# Patient Record
Sex: Female | Born: 1980 | Race: White | Hispanic: No | Marital: Single | Smoking: Current every day smoker
Health system: Southern US, Community
[De-identification: ages and names within clinical notes are randomized; demographics above are authoritative.]

## PROBLEM LIST (undated history)

## (undated) DIAGNOSIS — F32A Depression, unspecified: Secondary | ICD-10-CM

## (undated) DIAGNOSIS — F329 Major depressive disorder, single episode, unspecified: Secondary | ICD-10-CM

## (undated) DIAGNOSIS — F1910 Other psychoactive substance abuse, uncomplicated: Secondary | ICD-10-CM

## (undated) DIAGNOSIS — F419 Anxiety disorder, unspecified: Secondary | ICD-10-CM

## (undated) HISTORY — PX: NO PAST SURGERIES: SHX2092

---
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
## 2008-10-09 ENCOUNTER — Emergency Department (HOSPITAL_COMMUNITY): Admission: EM | Admit: 2008-10-09 | Discharge: 2008-10-09 | Payer: Self-pay | Admitting: Emergency Medicine

## 2010-03-02 DIAGNOSIS — F1910 Other psychoactive substance abuse, uncomplicated: Secondary | ICD-10-CM

## 2010-03-02 HISTORY — DX: Other psychoactive substance abuse, uncomplicated: F19.10

## 2010-03-28 ENCOUNTER — Emergency Department (HOSPITAL_COMMUNITY)
Admission: EM | Admit: 2010-03-28 | Discharge: 2010-03-28 | Payer: Self-pay | Source: Home / Self Care | Admitting: Emergency Medicine

## 2010-06-08 LAB — URINALYSIS, ROUTINE W REFLEX MICROSCOPIC
Nitrite: NEGATIVE
Specific Gravity, Urine: 1.017 (ref 1.005–1.030)
Urobilinogen, UA: 1 mg/dL (ref 0.0–1.0)

## 2010-06-08 LAB — CBC
Platelets: 243 10*3/uL (ref 150–400)
RDW: 13.4 % (ref 11.5–15.5)

## 2010-06-08 LAB — RAPID URINE DRUG SCREEN, HOSP PERFORMED
Amphetamines: NOT DETECTED
Cocaine: NOT DETECTED
Opiates: NOT DETECTED
Tetrahydrocannabinol: POSITIVE — AB

## 2010-06-08 LAB — BASIC METABOLIC PANEL
BUN: 3 mg/dL — ABNORMAL LOW (ref 6–23)
Calcium: 9.3 mg/dL (ref 8.4–10.5)
GFR calc non Af Amer: >60 mL/min (ref >60)
Glucose, Bld: 101 mg/dL — ABNORMAL HIGH (ref 70–99)
Sodium: 140 mEq/L (ref 135–145)

## 2010-06-08 LAB — URINE MICROSCOPIC-ADD ON
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
## 2018-11-10 ENCOUNTER — Encounter (HOSPITAL_COMMUNITY): Payer: Self-pay | Admitting: Certified Nurse Midwife

## 2018-11-10 ENCOUNTER — Inpatient Hospital Stay (HOSPITAL_COMMUNITY)
Admission: AD | Admit: 2018-11-10 | Discharge: 2018-11-10 | Payer: Self-pay | Attending: Obstetrics and Gynecology | Admitting: Obstetrics and Gynecology

## 2018-11-10 DIAGNOSIS — Z3A15 15 weeks gestation of pregnancy: Secondary | ICD-10-CM | POA: Insufficient documentation

## 2018-11-10 DIAGNOSIS — R109 Unspecified abdominal pain: Secondary | ICD-10-CM

## 2018-11-10 DIAGNOSIS — F17210 Nicotine dependence, cigarettes, uncomplicated: Secondary | ICD-10-CM | POA: Insufficient documentation

## 2018-11-10 DIAGNOSIS — O99322 Drug use complicating pregnancy, second trimester: Secondary | ICD-10-CM

## 2018-11-10 DIAGNOSIS — R102 Pelvic and perineal pain: Secondary | ICD-10-CM | POA: Insufficient documentation

## 2018-11-10 DIAGNOSIS — O99332 Smoking (tobacco) complicating pregnancy, second trimester: Secondary | ICD-10-CM | POA: Insufficient documentation

## 2018-11-10 DIAGNOSIS — F1120 Opioid dependence, uncomplicated: Secondary | ICD-10-CM

## 2018-11-10 DIAGNOSIS — O99612 Diseases of the digestive system complicating pregnancy, second trimester: Secondary | ICD-10-CM | POA: Insufficient documentation

## 2018-11-10 DIAGNOSIS — O26899 Other specified pregnancy related conditions, unspecified trimester: Secondary | ICD-10-CM

## 2018-11-10 DIAGNOSIS — O26892 Other specified pregnancy related conditions, second trimester: Secondary | ICD-10-CM | POA: Insufficient documentation

## 2018-11-10 DIAGNOSIS — K5900 Constipation, unspecified: Secondary | ICD-10-CM | POA: Insufficient documentation

## 2018-11-10 DIAGNOSIS — O09522 Supervision of elderly multigravida, second trimester: Secondary | ICD-10-CM | POA: Insufficient documentation

## 2018-11-10 HISTORY — DX: Depression, unspecified: F32.A

## 2018-11-10 HISTORY — DX: Anxiety disorder, unspecified: F41.9

## 2018-11-10 LAB — URINALYSIS, ROUTINE W REFLEX MICROSCOPIC
Bilirubin Urine: NEGATIVE
Glucose, UA: NEGATIVE mg/dL
Hgb urine dipstick: NEGATIVE
Ketones, ur: NEGATIVE mg/dL
Leukocytes,Ua: NEGATIVE
Nitrite: NEGATIVE
Protein, ur: NEGATIVE mg/dL
Specific Gravity, Urine: 1.017 (ref 1.005–1.030)
pH: 8 (ref 5.0–8.0)

## 2018-11-10 LAB — POCT PREGNANCY, URINE: Preg Test, Ur: POSITIVE — AB

## 2018-11-10 LAB — RAPID URINE DRUG SCREEN, HOSP PERFORMED
Amphetamines: POSITIVE — AB
Barbiturates: NOT DETECTED
Benzodiazepines: NOT DETECTED
Cocaine: NOT DETECTED
Opiates: POSITIVE — AB
Tetrahydrocannabinol: POSITIVE — AB

## 2018-11-10 LAB — CBC
HCT: 37.3 % (ref 36.0–46.0)
Hemoglobin: 12.6 g/dL (ref 12.0–15.0)
MCH: 28.9 pg (ref 26.0–34.0)
MCHC: 33.8 g/dL (ref 30.0–36.0)
MCV: 85.6 fL (ref 80.0–100.0)
Platelets: 194 10*3/uL (ref 150–400)
RBC: 4.36 MIL/uL (ref 3.87–5.11)
RDW: 13.5 % (ref 11.5–15.5)
WBC: 6.9 10*3/uL (ref 4.0–10.5)
nRBC: 0 % (ref 0.0–0.2)

## 2018-11-10 LAB — WET PREP, GENITAL
Sperm: NONE SEEN
Trich, Wet Prep: NONE SEEN
Yeast Wet Prep HPF POC: NONE SEEN

## 2018-11-10 MED ORDER — POLYETHYLENE GLYCOL 3350 17 G PO PACK: 17.0000 g | PACK | Freq: Two times a day (BID) | ORAL | 0 refills | Status: AC | PRN

## 2018-11-10 MED ORDER — ACETAMINOPHEN 500 MG PO TABS
1000.0000 mg | ORAL_TABLET | Freq: Four times a day (QID) | ORAL | Status: DC | PRN
  Administered 2018-11-10: 1000 mg via ORAL
  Filled 2018-11-10: qty 2

## 2018-11-10 NOTE — Discharge Instructions (Signed)
Abdominal Pain During Pregnancy  Abdominal pain is common during pregnancy, and has many possible causes. Some causes are more serious than others, and sometimes the cause is not known. Abdominal pain can be a sign that labor is starting. It can also be caused by normal growth and stretching of muscles and ligaments during pregnancy. Always tell your health care provider if you have any abdominal pain. Follow these instructions at home:  Do not have sex or put anything in your vagina until your pain goes away completely.  Get plenty of rest until your pain improves.  Drink enough fluid to keep your urine pale yellow.  Take over-the-counter and prescription medicines only as told by your health care provider.  Keep all follow-up visits as told by your health care provider. This is important. Contact a health care provider if:  Your pain continues or gets worse after resting.  You have lower abdominal pain that: ? Comes and goes at regular intervals. ? Spreads to your back. ? Is similar to menstrual cramps.  You have pain or burning when you urinate. Get help right away if:  You have a fever or chills.  You have vaginal bleeding.  You are leaking fluid from your vagina.  You are passing tissue from your vagina.  You have vomiting or diarrhea that lasts for more than 24 hours.  Your baby is moving less than usual.  You feel very weak or faint.  You have shortness of breath.  You develop severe pain in your upper abdomen. Summary  Abdominal pain is common during pregnancy, and has many possible causes.  If you experience abdominal pain during pregnancy, tell your health care provider right away.  Follow your health care provider's home care instructions and keep all follow-up visits as directed. This information is not intended to replace advice given to you by your health care provider. Make sure you discuss any questions you have with your health care  provider. Document Released: 02/16/2005 Document Revised: 06/06/2018 Document Reviewed: 05/21/2016 Elsevier Patient Education  2020 [employer].  Constipation, Adult Constipation is when a person has fewer bowel movements in a week than normal, has difficulty having a bowel movement, or has stools that are dry, hard, or larger than normal. Constipation may be caused by an underlying condition. It may become worse with age if a person takes certain medicines and does not take in enough fluids. Follow these instructions at home: Eating and drinking   Eat foods that have a lot of fiber, such as fresh fruits and vegetables, whole grains, and beans.  Limit foods that are high in fat, low in fiber, or overly processed, such as french fries, hamburgers, cookies, candies, and soda.  Drink enough fluid to keep your urine clear or pale yellow. General instructions  Exercise regularly or as told by your health care provider.  Go to the restroom when you have the urge to go. Do not hold it in.  Take over-the-counter and prescription medicines only as told by your health care provider. These include any fiber supplements.  Practice pelvic floor retraining exercises, such as deep breathing while relaxing the lower abdomen and pelvic floor relaxation during bowel movements.  Watch your condition for any changes.  Keep all follow-up visits as told by your health care provider. This is important. Contact a health care provider if:  You have pain that gets worse.  You have a fever.  You do not have a bowel movement after 4 days.  You vomit.  You are not hungry.  You lose weight.  You are bleeding from the anus.  You have thin, pencil-like stools. Get help right away if:  You have a fever and your symptoms suddenly get worse.  You leak stool or have blood in your stool.  Your abdomen is bloated.  You have severe pain in your abdomen.  You feel dizzy or you faint. This  information is not intended to replace advice given to you by your health care provider. Make sure you discuss any questions you have with your health care provider. Document Released: 11/15/2003 Document Revised: 01/29/2017 Document Reviewed: 08/07/2015 Elsevier Patient Education  2020 Elsevier Inc[employer]  [facility] Providers    [facility]       Phone: [phone]  [facility] [place]   Phone: [phone]  [facility] [place]  Phone: [phone]  [facility]  Phone: [phone]  [facility] [place]  Phone: [phone]  [facility]   Phone: [phone]  [facility]       Phone: [phone]  [facility]    Phone: [phone]   [facility]    Phone: [phone]  [facility]    Phone: [phone]  [facility]    Phone: [phone]  [facility]       Phone: [phone]   [facility]  Phone: [phone]  [facility]    Phone: [phone]  [facility]   Phone: [phone]  [facility]      Phone: [phone]  [facility]    Phone: [phone]

## 2018-11-10 NOTE — MAU Provider Note (Addendum)
History     CSN: [id number]  Arrival date and time: 11/10/18 1245   First Provider Initiated Contact with Patient 11/10/18 1929      Chief Complaint  Patient presents with  . Abdominal Pain   38 y.o. [id number] @15 .3 wks by sure LMP presenting for abd cramping. She presented after being arrested by [employer]. Reports onset 2 hrs ago. Pain is lower in abd, central, and intermittent. Rates 7/10. Has not tried anything for it. Denies urinary sx. No VB or discharge. No fevers. Reports no BM in 1 week but has small hard stool today.   RN Note: Pt transferred from [facility] for pregnancy and abd cramping. Escorted by [employer]. Pt arrested and told officers she was having abd cramping.Pt reports she started feeling cramping about 2 days ago.  Pt denies any vag bleeding but reports some clear vag discharge  OB History    Gravida  6   Para  5   Term  5   Preterm      AB      Living  5     SAB      TAB      Ectopic      Multiple      Live Births              Past Medical History:  Diagnosis Date  . Anxiety   . Depression   . Polysubstance abuse ([place]) 2012    Past Surgical History:  Procedure Laterality Date  . NO PAST SURGERIES      History reviewed. No pertinent family history.  Social History   Tobacco Use  . Smoking status: Current Every Day Smoker    Packs/day: 1.00    Types: Cigarettes  Substance Use Topics  . Alcohol use: Not Currently  . Drug use: Not Currently    Allergies:  Allergies  Allergen Reactions  . Penicillins Rash    No medications prior to admission.    Review of Systems  Constitutional: Negative for fever.  Gastrointestinal: Positive for abdominal pain and constipation. Negative for diarrhea, nausea and vomiting.  Genitourinary: Negative for dysuria, hematuria, urgency, vaginal bleeding and vaginal discharge.   Physical Exam   Blood pressure 115/73, pulse 91, temperature 98.1 F (36.7 C), resp. rate 18, height 5\' 8"  (1.727 m), weight  72.1 kg, last menstrual period 07/25/2018.  Physical Exam  Nursing note and vitals reviewed. Constitutional: She is oriented to person, place, and time. She appears well-developed and well-nourished. No distress.  HENT:  Head: Normocephalic and atraumatic.  Neck: Normal range of motion.  Respiratory: Effort normal. No respiratory distress.  GI: Soft. She exhibits no distension and no mass. There is no abdominal tenderness. There is no rebound and no guarding.  FH 3 below U  Genitourinary:    Genitourinary Comments: External: no lesions or erythema Vagina: rugated, pink, moist, scant white discharge, nabothian present Uterus: + enlarged, anteverted, non tender, no CMT Cervix closed/long    Musculoskeletal: Normal range of motion.  Neurological: She is alert and oriented to person, place, and time.  Skin: Skin is warm and dry.  Psychiatric: She has a normal mood and affect.  FHT 152  Results for orders placed or performed during the hospital encounter of 11/10/18 (from the past 24 hour(s))  Pregnancy, urine POC     Status: Abnormal   Collection Time: 11/10/18  6:44 PM  Result Value Ref Range   Preg Test, Ur POSITIVE (A) NEGATIVE  Urinalysis, Routine w  reflex microscopic     Status: Abnormal   Collection Time: 11/10/18  6:53 PM  Result Value Ref Range   Color, Urine YELLOW YELLOW   APPearance CLOUDY (A) CLEAR   Specific Gravity, Urine 1.017 1.005 - 1.030   pH 8.0 5.0 - 8.0   Glucose, UA NEGATIVE NEGATIVE mg/dL   Hgb urine dipstick NEGATIVE NEGATIVE   Bilirubin Urine NEGATIVE NEGATIVE   Ketones, ur NEGATIVE NEGATIVE mg/dL   Protein, ur NEGATIVE NEGATIVE mg/dL   Nitrite NEGATIVE NEGATIVE   Leukocytes,Ua NEGATIVE NEGATIVE  Wet prep, genital     Status: Abnormal   Collection Time: 11/10/18  7:30 PM  Result Value Ref Range   Yeast Wet Prep HPF POC NONE SEEN NONE SEEN   Trich, Wet Prep NONE SEEN NONE SEEN   Clue Cells Wet Prep HPF POC PRESENT (A) NONE SEEN   WBC, Wet Prep HPF  POC FEW (A) NONE SEEN   Sperm NONE SEEN   CBC     Status: None   Collection Time: 11/10/18  7:58 PM  Result Value Ref Range   WBC 6.9 4.0 - 10.5 K/uL   RBC 4.36 3.87 - 5.11 MIL/uL   Hemoglobin 12.6 12.0 - 15.0 g/dL   HCT 16.137.3 09.636.0 - 04.546.0 %   MCV 85.6 80.0 - 100.0 fL   MCH 28.9 26.0 - 34.0 pg   MCHC 33.8 30.0 - 36.0 g/dL   RDW 40.913.5 81.111.5 - 91.415.5 %   Platelets 194 150 - 400 K/uL   nRBC 0.0 0.0 - 0.2 %   MAU Course  Procedures Orders Placed This Encounter  Procedures  . Wet prep, genital    Standing Status:   Standing    Number of Occurrences:   1    Order Specific Question:   Patient immune status    Answer:   Normal  . Urinalysis, Routine w reflex microscopic    Standing Status:   Standing    Number of Occurrences:   1  . CBC    Standing Status:   Standing    Number of Occurrences:   1  . Urine rapid drug screen (hosp performed)    Standing Status:   Standing    Number of Occurrences:   1  . Pregnancy, urine POC    Standing Status:   Standing    Number of Occurrences:   1   Meds ordered this encounter  Medications  . acetaminophen (TYLENOL) tablet 1,000 mg   MDM Labs ordered. Transfer of care given to Lorenda PeckWilliams, CNM Bhambri, Melanie, CNM  11/10/2018 8:19 PM   Assumed care Patient very somnolent, difficult to arouse and keep awake for communication  Results for orders placed or performed during the hospital encounter of 11/10/18 (from the past 24 hour(s))  Pregnancy, urine POC     Status: Abnormal   Collection Time: 11/10/18  6:44 PM  Result Value Ref Range   Preg Test, Ur POSITIVE (A) NEGATIVE  Urinalysis, Routine w reflex microscopic     Status: Abnormal   Collection Time: 11/10/18  6:53 PM  Result Value Ref Range   Color, Urine YELLOW YELLOW   APPearance CLOUDY (A) CLEAR   Specific Gravity, Urine 1.017 1.005 - 1.030   pH 8.0 5.0 - 8.0   Glucose, UA NEGATIVE NEGATIVE mg/dL   Hgb urine dipstick NEGATIVE NEGATIVE   Bilirubin Urine NEGATIVE NEGATIVE    Ketones, ur NEGATIVE NEGATIVE mg/dL   Protein, ur NEGATIVE NEGATIVE mg/dL   Nitrite NEGATIVE  NEGATIVE   Leukocytes,Ua NEGATIVE NEGATIVE  Wet prep, genital     Status: Abnormal   Collection Time: 11/10/18  7:30 PM  Result Value Ref Range   Yeast Wet Prep HPF POC NONE SEEN NONE SEEN   Trich, Wet Prep NONE SEEN NONE SEEN   Clue Cells Wet Prep HPF POC PRESENT (A) NONE SEEN   WBC, Wet Prep HPF POC FEW (A) NONE SEEN   Sperm NONE SEEN   Urine rapid drug screen (hosp performed)     Status: Abnormal   Collection Time: 11/10/18  7:40 PM  Result Value Ref Range   Opiates POSITIVE (A) NONE DETECTED   Cocaine NONE DETECTED NONE DETECTED   Benzodiazepines NONE DETECTED NONE DETECTED   Amphetamines POSITIVE (A) NONE DETECTED   Tetrahydrocannabinol POSITIVE (A) NONE DETECTED   Barbiturates NONE DETECTED NONE DETECTED  CBC     Status: None   Collection Time: 11/10/18  7:58 PM  Result Value Ref Range   WBC 6.9 4.0 - 10.5 K/uL   RBC 4.36 3.87 - 5.11 MIL/uL   Hemoglobin 12.6 12.0 - 15.0 g/dL   HCT 31.5 40.0 - 86.7 %   MCV 85.6 80.0 - 100.0 fL   MCH 28.9 26.0 - 34.0 pg   MCHC 33.8 30.0 - 36.0 g/dL   RDW 61.9 50.9 - 32.6 %   Platelets 194 150 - 400 K/uL   nRBC 0.0 0.0 - 0.2 %   Reviewed lab results and reassuring exam findings Discussed constipation may be contributing to the pain Will rx Miralax   Assessment and Plan  A: Single IUP at [redacted]w[redacted]d Pelvic cramping Constipation Multiple substance abuse  P;   Discharge  Medical forms filled out for [facility] for Miralax, they can give it to her there List of OB providers given  Aviva Signs, CNM

## 2018-11-10 NOTE — MAU Note (Signed)
Pt transferred from [facility] for pregnancy and abd cramping. Escorted by [employer]. Pt arrested and told officers she was having abd cramping.Pt reports she started feeling cramping about 2 days ago.  Pt denies any vag bleeding but reports some clear vag discharge.

## 2018-11-12 LAB — GC/CHLAMYDIA PROBE AMP (~~LOC~~) NOT AT ARMC
Chlamydia: NEGATIVE
Neisseria Gonorrhea: POSITIVE — AB

## 2018-12-22 ENCOUNTER — Other Ambulatory Visit: Payer: Self-pay

## 2018-12-22 ENCOUNTER — Ambulatory Visit (INDEPENDENT_AMBULATORY_CARE_PROVIDER_SITE_OTHER): Payer: Self-pay | Admitting: *Deleted

## 2018-12-22 ENCOUNTER — Encounter: Payer: Self-pay | Admitting: General Practice

## 2018-12-22 ENCOUNTER — Encounter: Payer: Self-pay | Admitting: *Deleted

## 2018-12-22 ENCOUNTER — Encounter: Payer: Self-pay | Admitting: Obstetrics & Gynecology

## 2018-12-22 DIAGNOSIS — O99320 Drug use complicating pregnancy, unspecified trimester: Secondary | ICD-10-CM | POA: Insufficient documentation

## 2018-12-22 DIAGNOSIS — O0990 Supervision of high risk pregnancy, unspecified, unspecified trimester: Secondary | ICD-10-CM

## 2018-12-22 DIAGNOSIS — F1120 Opioid dependence, uncomplicated: Secondary | ICD-10-CM

## 2018-12-22 DIAGNOSIS — O09522 Supervision of elderly multigravida, second trimester: Secondary | ICD-10-CM

## 2018-12-22 DIAGNOSIS — O09529 Supervision of elderly multigravida, unspecified trimester: Secondary | ICD-10-CM | POA: Insufficient documentation

## 2018-12-22 DIAGNOSIS — O09899 Supervision of other high risk pregnancies, unspecified trimester: Secondary | ICD-10-CM | POA: Insufficient documentation

## 2018-12-22 NOTE — Progress Notes (Signed)
2:30 I called the jail as instructed and spoke with Deion. She reports [place] has been released from jail.  I called the number on file for [place] and heard a message " this is Joy" I left a message I am calling for [facility] and if this is her number - please let her know to call us about her appointment. I called her contact number and a female answered and said she is in [place].  Linda,RN  2:35 I called [place] number again and again heard message from Derma. I again left message to let trying to reach [facility], please ask her to call us . Linda,RN

## 2018-12-23 ENCOUNTER — Encounter: Payer: Self-pay | Admitting: Obstetrics and Gynecology
